# Patient Record
Sex: Male | Born: 1995 | Race: White | Hispanic: No | Marital: Single | Smoking: Former smoker
Health system: Southern US, Community
[De-identification: ages and names within clinical notes are randomized; demographics above are authoritative.]

## PROBLEM LIST (undated history)

## (undated) DIAGNOSIS — F329 Major depressive disorder, single episode, unspecified: Secondary | ICD-10-CM

## (undated) DIAGNOSIS — F32A Depression, unspecified: Secondary | ICD-10-CM

---
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
## 2016-12-30 ENCOUNTER — Emergency Department
Admission: EM | Admit: 2016-12-30 | Discharge: 2016-12-30 | Disposition: A | Discharge status: Home / Self Care | Payer: 59 | Attending: Emergency Medicine | Admitting: Emergency Medicine

## 2016-12-30 ENCOUNTER — Encounter: Payer: Self-pay | Admitting: Vascular Surgery

## 2016-12-30 ENCOUNTER — Emergency Department: Payer: 59

## 2016-12-30 DIAGNOSIS — R0600 Dyspnea, unspecified: Secondary | ICD-10-CM

## 2016-12-30 DIAGNOSIS — R0602 Shortness of breath: Secondary | ICD-10-CM | POA: Diagnosis not present

## 2016-12-30 DIAGNOSIS — F410 Panic disorder [episodic paroxysmal anxiety] without agoraphobia: Secondary | ICD-10-CM | POA: Insufficient documentation

## 2016-12-30 DIAGNOSIS — R1013 Epigastric pain: Secondary | ICD-10-CM | POA: Insufficient documentation

## 2016-12-30 DIAGNOSIS — Z791 Long term (current) use of non-steroidal anti-inflammatories (NSAID): Secondary | ICD-10-CM | POA: Diagnosis not present

## 2016-12-30 DIAGNOSIS — F17210 Nicotine dependence, cigarettes, uncomplicated: Secondary | ICD-10-CM | POA: Insufficient documentation

## 2016-12-30 DIAGNOSIS — R002 Palpitations: Secondary | ICD-10-CM

## 2016-12-30 HISTORY — DX: Depression, unspecified: F32.A

## 2016-12-30 HISTORY — DX: Major depressive disorder, single episode, unspecified: F32.9

## 2016-12-30 LAB — CBC WITH DIFFERENTIAL/PLATELET
BASOS ABS: 0.1 10*3/uL (ref 0–0.1)
Basophils Relative: 1 %
EOS PCT: 12 %
Eosinophils Absolute: 1.6 10*3/uL — ABNORMAL HIGH (ref 0–0.7)
HCT: 47 % (ref 40.0–52.0)
Hemoglobin: 16 g/dL (ref 13.0–18.0)
LYMPHS PCT: 29 %
Lymphs Abs: 3.7 10*3/uL — ABNORMAL HIGH (ref 1.0–3.6)
MCH: 30.1 pg (ref 26.0–34.0)
MCHC: 34.1 g/dL (ref 32.0–36.0)
MCV: 88.3 fL (ref 80.0–100.0)
MONO ABS: 1.1 10*3/uL — AB (ref 0.2–1.0)
Monocytes Relative: 9 %
Neutro Abs: 6.2 10*3/uL (ref 1.4–6.5)
Neutrophils Relative %: 49 %
PLATELETS: 268 10*3/uL (ref 150–440)
RBC: 5.32 MIL/uL (ref 4.40–5.90)
RDW: 12.9 % (ref 11.5–14.5)
WBC: 12.8 10*3/uL — ABNORMAL HIGH (ref 3.8–10.6)

## 2016-12-30 LAB — URINALYSIS, COMPLETE (UACMP) WITH MICROSCOPIC
Bacteria, UA: NONE SEEN
Bilirubin Urine: NEGATIVE
GLUCOSE, UA: NEGATIVE mg/dL
Ketones, ur: NEGATIVE mg/dL
Nitrite: NEGATIVE
PH: 7 (ref 5.0–8.0)
Protein, ur: NEGATIVE mg/dL
SPECIFIC GRAVITY, URINE: 1.004 — AB (ref 1.005–1.030)
SQUAMOUS EPITHELIAL / LPF: NONE SEEN

## 2016-12-30 LAB — COMPREHENSIVE METABOLIC PANEL
ALK PHOS: 69 U/L (ref 38–126)
ALT: 28 U/L (ref 17–63)
ANION GAP: 9 (ref 5–15)
AST: 36 U/L (ref 15–41)
Albumin: 4.7 g/dL (ref 3.5–5.0)
BILIRUBIN TOTAL: 0.7 mg/dL (ref 0.3–1.2)
BUN: 17 mg/dL (ref 6–20)
CALCIUM: 9.7 mg/dL (ref 8.9–10.3)
CO2: 26 mmol/L (ref 22–32)
Chloride: 102 mmol/L (ref 101–111)
Creatinine, Ser: 1.05 mg/dL (ref 0.61–1.24)
GFR calc non Af Amer: >60 mL/min (ref >60)
GLUCOSE: 102 mg/dL — AB (ref 65–99)
POTASSIUM: 3.5 mmol/L (ref 3.5–5.1)
SODIUM: 137 mmol/L (ref 135–145)
TOTAL PROTEIN: 7.7 g/dL (ref 6.5–8.1)

## 2016-12-30 LAB — LIPASE, BLOOD: Lipase: 25 U/L (ref 11–51)

## 2016-12-30 MED ORDER — GI COCKTAIL ~~LOC~~
ORAL | Status: AC
  Filled 2016-12-30: qty 30

## 2016-12-30 MED ORDER — IOPAMIDOL (ISOVUE-300) INJECTION 61%: 30.0000 mL | Freq: Once | INTRAVENOUS | Status: DC | PRN

## 2016-12-30 MED ORDER — GI COCKTAIL ~~LOC~~
30.0000 mL | Freq: Once | ORAL | Status: AC
  Administered 2016-12-30: 30 mL via ORAL

## 2016-12-30 MED ORDER — SODIUM CHLORIDE 0.9 % IV BOLUS (SEPSIS)
1000.0000 mL | Freq: Once | INTRAVENOUS | Status: AC
  Administered 2016-12-30: 1000 mL via INTRAVENOUS

## 2016-12-30 NOTE — Discharge Instructions (Signed)
You were evaluated for left-sided chest discomfort, palpitations, and shortness of breath, and as we discussed, although no certain cause was found, your exam and evaluation are reassuring in the emergency department today. As we discussed, I am most suspicious for panic attack as cause of the symptoms.  Return to emergency department immediately for any worsening symptoms, including pain with breathing, shortness of breath, heart racing, dizziness, passing out, weakness, numbness, or any other symptoms concerning to you.  I discussed, I would recommend following up with a psychologist, either through the [facility] clinic, or you may try through [facility], or by speaking with your insurance company for who may be covered.

## 2016-12-30 NOTE — ED Triage Notes (Signed)
Pt reports to the ED for eval of LUQ abd pain. Onset today. States he had a fatty meal last night (mayo and pesto). He states that around 8:00 am yesterday and he ignored it but became worse so he called 911. He denies any N/V/D, fevers, chills, or recent ETOH use. 12 lead obtain en route was unremarkable. Pt denies any hx of abd surgery.

## 2016-12-30 NOTE — ED Provider Notes (Signed)
[facility] Emergency Department Provider Note ____________________________________________   I have reviewed the triage vital signs and the triage nursing note.  HISTORY  Chief Complaint Abdominal Pain   Historian Patient  HPI Con Arganbright is a 21 y.o. male arrived by EMS with complaint of several hours of epigastric and left upper quadrant pain that started this morning. As a child he had some issues with chronic abdominal pain and indigestion although states it doesn't typically bother him.  No nausea or vomiting or diarrhea. No fever. No chest pain or coughing. Pain is moderate to severe in the midepigastrium and left upper quadrant. No lower abdominal pain.  No urinary symptoms.  Nothing seems to make it worse or better.   When I went back to talk to him again, he stated somewhat different description, which was left upper quadrant or lower chest pain associated with heart racing and shortness of breath. No pleuritic pain. He states he called because the heart was racing.    Past Medical History:  Diagnosis Date  . Depression   History of bipolar, no longer medication.  There are no active problems to display for this patient.   History reviewed. No pertinent surgical history.  Prior to Admission medications   Medication Sig Start Date End Date Taking? Authorizing Provider  diazepam (VALIUM) 5 MG tablet Take 1 tablet (5 mg total) by mouth every 8 (eight) hours as needed for muscle spasms. Patient not taking: Reported on 12/30/2016 11/10/15   Emily Filbert, MD  ibuprofen (ADVIL,MOTRIN) 800 MG tablet Take 1 tablet (800 mg total) by mouth every 8 (eight) hours as needed. 11/10/15   Emily Filbert, MD    Allergies  Allergen Reactions  . Other     Walnut   . Penicillins     Childhood allergy    No family history on file.  Social History Social History  Substance Use Topics  . Smoking status: Current Some Day Smoker    Types:  Cigarettes  . Smokeless tobacco: Never Used  . Alcohol use Yes     Comment: occasionally (weekly)    Review of Systems  Constitutional: Negative for fever. Eyes: Negative for visual changes. ENT: Negative for sore throat. Cardiovascular: Negative for Pleuritic chest pain. No ongoing chest pain.Marland Kitchen Respiratory: Positive for shortness of breath, earlier and currently gone. Gastrointestinal: Negative for vomiting and diarrhea. Genitourinary: Negative for dysuria. Musculoskeletal: Negative for back pain. Skin: Negative for rash. Neurological: Negative for headache. 10 point Review of Systems otherwise negative ____________________________________________   PHYSICAL EXAM:  VITAL SIGNS: ED Triage Vitals  Enc Vitals Group     BP 12/30/16 0900 137/80     Pulse Rate 12/30/16 0900 77     Resp 12/30/16 0903 16     Temp 12/30/16 0903 98.5 F (36.9 C)     Temp Source 12/30/16 0903 Oral     SpO2 12/30/16 0900 100 %     Weight --      Height --      Head Circumference --      Peak Flow --      Pain Score 12/30/16 0903 5     Pain Loc --      Pain Edu? --      Excl. in GC? --      Constitutional: Alert and oriented. Well appearing and in no distress. HEENT   Head: Normocephalic and atraumatic.      Eyes: Conjunctivae are normal. PERRL. Normal extraocular movements.  Ears:         Nose: No congestion/rhinnorhea.   Mouth/Throat: Mucous membranes are moist.   Neck: No stridor. Cardiovascular/Chest: Normal rate, regular rhythm.  No murmurs, rubs, or gallops. Respiratory: Normal respiratory effort without tachypnea nor retractions. Breath sounds are clear and equal bilaterally. No wheezes/rales/rhonchi. Gastrointestinal: Soft. No distention, no guarding, no rebound. Moderate tenderness to palpation in epigastrium and left upper quadrant. Mild tenderness over to the right upper quadrant. No lower abdominal tenderness to palpation.   Genitourinary/rectal:Deferred Musculoskeletal: Nontender with normal range of motion in all extremities. No joint effusions.  No lower extremity tenderness.  No edema. Neurologic:  Normal speech and language. No gross or focal neurologic deficits are appreciated. Skin:  Skin is warm, dry and intact. No rash noted. Psychiatric: Fairly anxious. Affect normal. Speech and behavior are normal. Patient exhibits appropriate insight and judgment.   ____________________________________________  LABS (pertinent positives/negatives)  Labs Reviewed  COMPREHENSIVE METABOLIC PANEL - Abnormal; Notable for the following:       Result Value   Glucose, Bld 102 (*)    All other components within normal limits  CBC WITH DIFFERENTIAL/PLATELET - Abnormal; Notable for the following:    WBC 12.8 (*)    Lymphs Abs 3.7 (*)    Monocytes Absolute 1.1 (*)    Eosinophils Absolute 1.6 (*)    All other components within normal limits  URINALYSIS, COMPLETE (UACMP) WITH MICROSCOPIC - Abnormal; Notable for the following:    Color, Urine STRAW (*)    APPearance CLEAR (*)    Specific Gravity, Urine 1.004 (*)    Hgb urine dipstick SMALL (*)    Leukocytes, UA TRACE (*)    All other components within normal limits  URINE CULTURE  LIPASE, BLOOD    ____________________________________________    EKG I, Governor Rooks, MD, the attending physician have personally viewed and interpreted all ECGs.  70 bpm. Normal sinus rhythm. Narrow QRS. Normal axis. Normal ST and T-wave ____________________________________________  RADIOLOGY All Xrays were viewed by me. Imaging interpreted by Radiologist.  Chest x-ray: Negative chest. __________________________________________  PROCEDURES  Procedure(s) performed: None  Critical Care performed: None  ____________________________________________   ED COURSE / ASSESSMENT AND PLAN  Pertinent labs & imaging results that were available during my care of the patient were  reviewed by me and considered in my medical decision making (see chart for details).   Initially EMS was reporting patient complaint of abdominal pain, and initially patient told me the middle and left upper quadrant or uncomfortable.  Laboratory studies were overall reassuring, but he did have a minimally elevated white blood cell count. No urinary symptoms, there was some white blood cells in the urine. On exam mild tenderness in the epigastrium and left upper quadrant. No lower abdominal tenderness. Unsure what to make of the urinalysis results. I will send a culture.  On reexamination after GI cocktail for what seemed to me most likely consistent with GERD or gastritis, patient stated that he had no more pain.  He was at that point able to give me more history and it sounds like maybe this was more of a panic attack. He states that he started with a left-sided lower chest discomfort and he started researching on the Internet and was up all night getting concerned about what might be wrong. At one point he started hyperventilating and having increased respiratory rate and heart racing. It was at this point that he called the ambulance.  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  He does have history of bipolar but has been off of medications for the a long time for that. He does not seem to be in acute depression or mania, and seems quite rational in discussing the role anxiety may be playing.  I doubt PE, ACS.  OK for discharge from ED today. Recommending follow-up with primary care doctor as well as a mental health provider.  I don't think he needs any emergency mental health treatment.    CONSULTATIONS:  None   Patient / Family / Caregiver informed of clinical course, medical decision-making process, and agree with plan.   I discussed return precautions, follow-up instructions, and discharge instructions with patient and/or family.  Discharge  instructions:  You were evaluated for left-sided chest discomfort, palpitations, and shortness of breath, and as we discussed, although no certain cause was found, your exam and evaluation are reassuring in the emergency department today. As we discussed, I am most suspicious for panic attack as cause of the symptoms.  Return to emergency department immediately for any worsening symptoms, including pain with breathing, shortness of breath, heart racing, dizziness, passing out, weakness, numbness, or any other symptoms concerning to you.  I discussed, I would recommend following up with a psychologist, either through the [facility] clinic, or you may try through [facility], or by speaking with your insurance company for who may be covered.   ___________________________________________   FINAL CLINICAL IMPRESSION(S) / ED DIAGNOSES   Final diagnoses:  Palpitations  Dyspnea, unspecified type  Panic attack              Note: This dictation was prepared with Dragon dictation. Any transcriptional errors that result from this process are unintentional    Governor Rooks, MD 12/30/16 1154

## 2016-12-31 LAB — URINE CULTURE: Culture: NO GROWTH

## 2017-05-31 ENCOUNTER — Emergency Department
Admission: EM | Admit: 2017-05-31 | Discharge: 2017-05-31 | Disposition: A | Discharge status: Home / Self Care | Payer: 59 | Attending: Emergency Medicine | Admitting: Emergency Medicine

## 2017-05-31 DIAGNOSIS — Z87891 Personal history of nicotine dependence: Secondary | ICD-10-CM | POA: Diagnosis not present

## 2017-05-31 DIAGNOSIS — F329 Major depressive disorder, single episode, unspecified: Secondary | ICD-10-CM | POA: Insufficient documentation

## 2017-05-31 DIAGNOSIS — T781XXA Other adverse food reactions, not elsewhere classified, initial encounter: Secondary | ICD-10-CM | POA: Insufficient documentation

## 2017-05-31 DIAGNOSIS — T7840XA Allergy, unspecified, initial encounter: Secondary | ICD-10-CM

## 2017-05-31 DIAGNOSIS — R1111 Vomiting without nausea: Secondary | ICD-10-CM | POA: Diagnosis present

## 2017-05-31 MED ORDER — PREDNISONE 20 MG PO TABS: 60.0000 mg | ORAL_TABLET | Freq: Every day | ORAL | 0 refills | Status: AC

## 2017-05-31 MED ORDER — FAMOTIDINE IN NACL 20-0.9 MG/50ML-% IV SOLN
20.0000 mg | Freq: Once | INTRAVENOUS | Status: AC
  Administered 2017-05-31: 20 mg via INTRAVENOUS
  Filled 2017-05-31: qty 50

## 2017-05-31 MED ORDER — DIPHENHYDRAMINE HCL 50 MG/ML IJ SOLN
25.0000 mg | Freq: Once | INTRAMUSCULAR | Status: AC
  Administered 2017-05-31: 25 mg via INTRAVENOUS
  Filled 2017-05-31: qty 1

## 2017-05-31 MED ORDER — METHYLPREDNISOLONE SODIUM SUCC 125 MG IJ SOLR
125.0000 mg | Freq: Once | INTRAMUSCULAR | Status: AC
  Administered 2017-05-31: 125 mg via INTRAVENOUS
  Filled 2017-05-31: qty 2

## 2017-05-31 MED ORDER — ONDANSETRON 4 MG PO TBDP: 4.0000 mg | ORAL_TABLET | Freq: Three times a day (TID) | ORAL | 0 refills | Status: AC | PRN

## 2017-05-31 MED ORDER — ONDANSETRON HCL 4 MG/2ML IJ SOLN
4.0000 mg | Freq: Once | INTRAMUSCULAR | Status: AC
  Administered 2017-05-31: 4 mg via INTRAVENOUS
  Filled 2017-05-31: qty 2

## 2017-05-31 MED ORDER — PROMETHAZINE HCL 25 MG/ML IJ SOLN
25.0000 mg | Freq: Once | INTRAMUSCULAR | Status: AC
Start: 2017-05-31 — End: 2017-05-31
  Administered 2017-05-31: 25 mg via INTRAVENOUS
  Filled 2017-05-31: qty 1

## 2017-05-31 MED ORDER — EPINEPHRINE 0.3 MG/0.3ML IJ SOAJ: 0.3000 mg | Freq: Once | INTRAMUSCULAR | 0 refills | Status: AC

## 2017-05-31 MED ORDER — DIPHENHYDRAMINE HCL 25 MG PO CAPS: 25.0000 mg | ORAL_CAPSULE | ORAL | 0 refills | Status: DC | PRN

## 2017-05-31 MED ORDER — SODIUM CHLORIDE 0.9 % IV BOLUS (SEPSIS)
1000.0000 mL | Freq: Once | INTRAVENOUS | Status: AC
  Administered 2017-05-31: 1000 mL via INTRAVENOUS

## 2017-05-31 MED ORDER — FAMOTIDINE 40 MG PO TABS: 40.0000 mg | ORAL_TABLET | Freq: Every evening | ORAL | 0 refills | Status: DC

## 2017-05-31 NOTE — ED Triage Notes (Signed)
Per EMS pt comes from [employer].  Pt tried a Kiwi for the first time and started to feel his throat close up and was itchy under his tongue.  EMS gave him a Benadryl and he began to vomit.  Pt is A&Ox4.  Pt is not vomiting upon arrival and states stomach is uncomfortable 5/10.

## 2017-05-31 NOTE — Discharge Instructions (Signed)
Please never eat Kiwi again, and make sure you always list it as an allergy.  Please read the instructions for the Epipen and carry it with you at all times.  Take the entire course of steroids (prednisone) and Pepcid, even if you are feeling better.  Take Benadryl as needed.  Return to the emergency department for rash, shortness of breath or difficulty swallowing, drooling, lightheadedness or fainting, or for any other symptoms concerning to you.

## 2017-05-31 NOTE — ED Provider Notes (Signed)
[facility] Emergency Department Provider Note  ____________________________________________  Time seen: Approximately 9:04 AM  I have reviewed the triage vital signs and the nursing notes.   HISTORY  Chief Complaint Allergic Reaction    HPI Jeremy Chapman is a 21 y.o. male with a remote history of allergy to penicillin and Augmentin, presenting with hives, nausea and vomiting after eating kiwi for the first time. At 7:50 AM, the patient tried kiwi for the first time in his life and developed immediatenausea and the sensation of throat closing. He was given an oral Benadryl tablet by EMS, but immediately vomited the entire pill. At this time, the patient is feeling significantly better, but continued to have an itchy throat.  He is hemodynamically stable on arrival to the ED.   Past Medical History:  Diagnosis Date  . Depression     There are no active problems to display for this patient.   History reviewed. No pertinent surgical history.  Current Outpatient Rx  . Order #: [id number] Class: Print  . Order #: [id number] Class: Print  . Order #: [id number] Class: Print  . Order #: [id number] Class: Print  . Order #: [id number] Class: Print  . Order #: [id number] Class: Print  . Order #: [id number] Class: Print    Allergies Kiwi extract; Other; and Penicillins  History reviewed. No pertinent family history.  Social History Social History  Substance Use Topics  . Smoking status: Former Smoker    Types: Cigarettes  . Smokeless tobacco: Never Used  . Alcohol use Yes     Comment: occasionally (weekly)    Review of Systems Constitutional: No fever/chills.no lightheadedness or syncope. Eyes: No visual changes. ENT: positive itchy throat and sensation of throat closing. No congestion or rhinorrhea. Cardiovascular: Denies chest pain. Denies palpitations. Respiratory: Denies shortness of breath.  No cough. Gastrointestinal: No abdominal pain.  positivenausea,  positive vomiting.  No diarrhea.  No constipation. Genitourinary: Negative for dysuria. Musculoskeletal: Negative for back pain. Skin: positive for facial hives. Neurological: Negative for headaches. No focal numbness, tingling or weakness.     ____________________________________________   PHYSICAL EXAM:  VITAL SIGNS: ED Triage Vitals  Enc Vitals Group     BP 05/31/17 0842 132/85     Pulse Rate 05/31/17 0842 81     Resp 05/31/17 0842 19     Temp 05/31/17 0842 98.3 F (36.8 C)     Temp Source 05/31/17 0842 Oral     SpO2 05/31/17 0842 97 %     Weight 05/31/17 0845 174 lb (78.9 kg)     Height 05/31/17 0845  (1.753 m)     Head Circumference --      Peak Flow --      Pain Score 05/31/17 0842 5     Pain Loc --      Pain Edu? --      Excl. in GC? --     Constitutional: Alert and oriented. Well appearing and in no acute distress. Answers questions appropriately. Eyes: Conjunctivae are normal.  EOMI. No scleral icterus. Head: Atraumatic. Nose: No congestion/rhinnorhea. Mouth/Throat: Mucous membranes are moist. No swelling of the lips, tongue. The posterior palate is symmetric without any swelling. The patient has a soft submandibular space. There is no stridor drooling, or difficulty maintaining secretions. Neck: No stridor.  Supple.  No meningismus. No submandibular lymphadenopathy. Cardiovascular: Normal rate, regular rhythm. No murmurs, rubs or gallops.  Respiratory: Normal respiratory effort.  No accessory muscle use or retractions. Lungs CTAB.  No wheezes, rales or  ronchi. Gastrointestinal: Soft, nontender and nondistended.  No guarding or rebound.  No peritoneal signs. Musculoskeletal: No LE edema. No ttp in the calves or palpable cords.  Negative Homan's sign. Neurologic:  A&Ox3.  Speech is clear.  Face and smile are symmetric.  EOMI.  Moves all extremities well. Skin:  Skin is warm, dry and intact. The patient has a diffusely erythematous face without any evidence of  rash on the chest, abdomen or back, or extremities. Psychiatric: Mood and affect are normal. Speech and behavior are normal.  Normal judgement.  ____________________________________________   LABS (all labs ordered are listed, but only abnormal results are displayed)  Labs Reviewed - No data to display ____________________________________________  EKG  ED ECG REPORT I, Rockne Menghini, the attending physician, personally viewed and interpreted this ECG.   Date: 05/31/2017  EKG Time: 927  Rate: 67  Rhythm: normal sinus rhythm  Axis: normal  Intervals:none  ST&T Change: No STEMI  ____________________________________________  RADIOLOGY  No results found.  ____________________________________________   PROCEDURES  Procedure(s) performed: None  Procedures  Critical Care performed: No ____________________________________________   INITIAL IMPRESSION / ASSESSMENT AND PLAN / ED COURSE  Pertinent labs & imaging results that were available during my care of the patient were reviewed by me and considered in my medical decision making (see chart for details).  21 y.o. Malewith a history of allergy to penicillin and Augmentin presenting with immediate vomiting, and sensation of throat closure after eating kiwi for the first time.Overall, the patient is hemodynamically stable and there is no evidence of hemodynamic collapse at this time. There is no evidence ofairway compromise. The patient does have some urticaria on the face, and throat itchy sensation, which we will treat with Solu-Medrol, Pepcid, and Benadryl. He will be given Zofran for his nausea as well as intravenous fluids. I have spoken with his mother, who is in [place], over the phone who agrees with the plan.Plan reevaluation for final disposition.  ----------------------------------------- 11:43 AM on 05/31/2017 -----------------------------------------  At this time, the patient continues to be  hemodynamically stable and is a symptomatic at this time. He is safe for discharge home. We discussed the medications he will be discharged with, and the importance of following up with a primary care physician. Return precautions were discussed. The patient will call his mother and I will be available for any questions as needed. Plan discharge at this time.  ____________________________________________  FINAL CLINICAL IMPRESSION(S) / ED DIAGNOSES  Final diagnoses:  Allergic reaction, initial encounter         NEW MEDICATIONS STARTED DURING THIS VISIT:  New Prescriptions   DIPHENHYDRAMINE (BENADRYL) 25 MG CAPSULE    Take 1 capsule (25 mg total) by mouth every 4 (four) hours as needed for itching or allergies.   EPINEPHRINE (EPIPEN 2-PAK) 0.3 MG/0.3 ML IJ SOAJ INJECTION    Inject 0.3 mLs (0.3 mg total) into the muscle once.   FAMOTIDINE (PEPCID) 40 MG TABLET    Take 1 tablet (40 mg total) by mouth every evening.   ONDANSETRON (ZOFRAN ODT) 4 MG DISINTEGRATING TABLET    Take 1 tablet (4 mg total) by mouth every 8 (eight) hours as needed for nausea or vomiting.   PREDNISONE (DELTASONE) 20 MG TABLET    Take 3 tablets (60 mg total) by mouth daily.      Rockne Menghini, MD 05/31/17 1148

## 2018-09-13 ENCOUNTER — Encounter: Payer: Self-pay | Admitting: Emergency Medicine

## 2018-09-13 ENCOUNTER — Emergency Department: Payer: 59

## 2018-09-13 ENCOUNTER — Emergency Department
Admission: EM | Admit: 2018-09-13 | Discharge: 2018-09-13 | Disposition: A | Discharge status: Home / Self Care | Payer: 59 | Attending: Emergency Medicine | Admitting: Emergency Medicine

## 2018-09-13 ENCOUNTER — Other Ambulatory Visit: Payer: Self-pay

## 2018-09-13 DIAGNOSIS — J189 Pneumonia, unspecified organism: Secondary | ICD-10-CM

## 2018-09-13 DIAGNOSIS — Z79899 Other long term (current) drug therapy: Secondary | ICD-10-CM | POA: Diagnosis not present

## 2018-09-13 DIAGNOSIS — Z87891 Personal history of nicotine dependence: Secondary | ICD-10-CM | POA: Insufficient documentation

## 2018-09-13 DIAGNOSIS — J181 Lobar pneumonia, unspecified organism: Secondary | ICD-10-CM

## 2018-09-13 DIAGNOSIS — R05 Cough: Secondary | ICD-10-CM | POA: Diagnosis present

## 2018-09-13 MED ORDER — DOXYCYCLINE HYCLATE 100 MG PO TABS: 100.0000 mg | ORAL_TABLET | Freq: Two times a day (BID) | ORAL | 0 refills | Status: DC

## 2018-09-13 MED ORDER — DOXYCYCLINE HYCLATE 100 MG PO TABS: 100.0000 mg | ORAL_TABLET | Freq: Two times a day (BID) | ORAL | 0 refills | Status: AC

## 2018-09-13 NOTE — ED Provider Notes (Signed)
[facility] Emergency Department Provider Note   ____________________________________________    I have reviewed the triage vital signs and the nursing notes.   HISTORY  Chief Complaint Cough     HPI Jeremy Chapman is a 23 y.o. male who presents with complaints of cough and mild shortness of breath and pleurisy.  Patient reports recently diagnosed with [facility], has been on Z-Pak, today is his third day.  Continues to feel fatigued, weak, mildly short of breath with exertion with a productive cough.  No recent travel.  No calf pain or swelling.  Does not smoke  Past Medical History:  Diagnosis Date  . Depression     There are no active problems to display for this patient.   History reviewed. No pertinent surgical history.  Prior to Admission medications   Medication Sig Start Date End Date Taking? Authorizing Provider  diazepam (VALIUM) 5 MG tablet Take 1 tablet (5 mg total) by mouth every 8 (eight) hours as needed for muscle spasms. Patient not taking: Reported on 12/30/2016 11/10/15   Emily Filbert, MD  diphenhydrAMINE (BENADRYL) 25 mg capsule Take 1 capsule (25 mg total) by mouth every 4 (four) hours as needed for itching or allergies. 05/31/17 05/31/18  Rockne Menghini, MD  doxycycline (VIBRA-TABS) 100 MG tablet Take 1 tablet (100 mg total) by mouth 2 (two) times daily. 09/13/18   Jene Every, MD  famotidine (PEPCID) 40 MG tablet Take 1 tablet (40 mg total) by mouth every evening. 05/31/17 05/31/18  Rockne Menghini, MD  ibuprofen (ADVIL,MOTRIN) 800 MG tablet Take 1 tablet (800 mg total) by mouth every 8 (eight) hours as needed. 11/10/15   Emily Filbert, MD  ondansetron (ZOFRAN ODT) 4 MG disintegrating tablet Take 1 tablet (4 mg total) by mouth every 8 (eight) hours as needed for nausea or vomiting. 05/31/17   Rockne Menghini, MD  predniSONE (DELTASONE) 20 MG tablet Take 3 tablets (60 mg total) by mouth  daily. 05/31/17   Rockne Menghini, MD     Allergies Kiwi extract; Other; and Penicillins  No family history on file.  Social History Social History   Tobacco Use  . Smoking status: Former Smoker    Types: Cigarettes  . Smokeless tobacco: Never Used  Substance Use Topics  . Alcohol use: Yes    Comment: occasionally (weekly)  . Drug use: No    Review of Systems  Constitutional: Positive fever  ENT: No sore throat. Cardiovascular: Pleurisy as above Respiratory: Above Gastrointestinal: No abdominal pain.    Musculoskeletal: Negative for calf pain or swelling  Neurological: Negative for headaches    ____________________________________________   PHYSICAL EXAM:  VITAL SIGNS: ED Triage Vitals  Enc Vitals Group     BP 09/13/18 1948 134/74     Pulse Rate 09/13/18 1948 91     Resp 09/13/18 1948 18     Temp 09/13/18 1948 98.6 F (37 C)     Temp Source 09/13/18 1948 Oral     SpO2 09/13/18 1948 100 %     Weight 09/13/18 1948 73.5 kg (162 lb)     Height 09/13/18 1948 1.753 m (5\' 9" )     Head Circumference --      Peak Flow --      Pain Score 09/13/18 1950 0     Pain Loc --      Pain Edu? --      Excl. in GC? --     Constitutional: Alert  and oriented. No acute distress. Pleasant and interactive Eyes: Conjunctivae are normal.   Nose: No congestion/rhinnorhea. Mouth/Throat: Mucous membranes are moist.    Cardiovascular: Normal rate, regular rhythm. Grossly normal heart sounds.  Good peripheral circulation. Respiratory: Normal respiratory effort.  No retractions.  Bibasilar Rales  Musculoskeletal: No lower extremity tenderness nor edema.  Warm and well perfused Neurologic:  Normal speech and language. No gross focal neurologic deficits are appreciated.  Skin:  Skin is warm, dry and intact. No rash noted. Psychiatric: Mood and affect are normal. Speech and behavior are normal.  ____________________________________________   LABS (all labs ordered are  listed, but only abnormal results are displayed)  Labs Reviewed - No data to display ____________________________________________  EKG  None ____________________________________________  RADIOLOGY  Chest x-ray demonstrates interstitial pattern left lung base consistent with pneumonia versus resolving pneumonia, discussed 9 mm nodule and the need for follow-up chest x-ray in 4 to 6 weeks ____________________________________________   PROCEDURES  Procedure(s) performed: No  Procedures   Critical Care performed: No ____________________________________________   INITIAL IMPRESSION / ASSESSMENT AND PLAN / ED COURSE  Pertinent labs & imaging results that were available during my care of the patient were reviewed by me and considered in my medical decision making (see chart for details).  Patient is only on day 3 of Z-Pak, he is allergic penicillin so we will not add high-dose amoxicillin.  I prescribed doxycycline for him to take counsel him to complete course of Z-Pak first to see how he feels.    ____________________________________________   FINAL CLINICAL IMPRESSION(S) / ED DIAGNOSES  Final diagnoses:  Community acquired pneumonia of left lower lobe of lung (HCC)        Note:  This document was prepared using Sales executive software and may include unintentional dictation errors.   Jene Every, MD 09/13/18 2045

## 2018-09-13 NOTE — ED Triage Notes (Signed)
Patient ambulatory to triage with steady gait, without difficulty or distress noted, mask place; pt reports dx with pneumonia last wk, rx zpak, benadryl and [facility]els[facility] ([facility]) but symptoms persist with prod cough green sputum

## 2018-09-13 NOTE — Discharge Instructions (Addendum)
As we discussed I have written an Rx for doxycycline. Please do not fill this until we see how you respond to the remainder of your Z-pak.   If you feel worsening sob you should return to the ED

## 2018-09-23 ENCOUNTER — Other Ambulatory Visit: Payer: Self-pay

## 2018-09-23 ENCOUNTER — Emergency Department
Admission: EM | Admit: 2018-09-23 | Discharge: 2018-09-23 | Disposition: A | Discharge status: Home / Self Care | Payer: 59 | Attending: Emergency Medicine | Admitting: Emergency Medicine

## 2018-09-23 ENCOUNTER — Emergency Department: Payer: 59

## 2018-09-23 DIAGNOSIS — G933 Postviral fatigue syndrome: Secondary | ICD-10-CM | POA: Insufficient documentation

## 2018-09-23 DIAGNOSIS — G9331 Postviral fatigue syndrome: Secondary | ICD-10-CM

## 2018-09-23 DIAGNOSIS — I308 Other forms of acute pericarditis: Secondary | ICD-10-CM | POA: Insufficient documentation

## 2018-09-23 DIAGNOSIS — I301 Infective pericarditis: Secondary | ICD-10-CM

## 2018-09-23 DIAGNOSIS — Z87891 Personal history of nicotine dependence: Secondary | ICD-10-CM | POA: Insufficient documentation

## 2018-09-23 DIAGNOSIS — R0602 Shortness of breath: Secondary | ICD-10-CM | POA: Diagnosis present

## 2018-09-23 LAB — CBC
HCT: 45.8 % (ref 39.0–52.0)
Hemoglobin: 15.6 g/dL (ref 13.0–17.0)
MCH: 30.4 pg (ref 26.0–34.0)
MCHC: 34.1 g/dL (ref 30.0–36.0)
MCV: 89.1 fL (ref 80.0–100.0)
Platelets: 334 10*3/uL (ref 150–400)
RBC: 5.14 MIL/uL (ref 4.22–5.81)
RDW: 12 % (ref 11.5–15.5)
WBC: 13.6 10*3/uL — ABNORMAL HIGH (ref 4.0–10.5)
nRBC: 0 % (ref 0.0–0.2)

## 2018-09-23 LAB — COMPREHENSIVE METABOLIC PANEL
ALT: 15 U/L (ref 0–44)
AST: 23 U/L (ref 15–41)
Albumin: 4.5 g/dL (ref 3.5–5.0)
Alkaline Phosphatase: 71 U/L (ref 38–126)
Anion gap: 8 (ref 5–15)
BUN: 15 mg/dL (ref 6–20)
CO2: 28 mmol/L (ref 22–32)
CREATININE: 1.12 mg/dL (ref 0.61–1.24)
Calcium: 9.5 mg/dL (ref 8.9–10.3)
Chloride: 104 mmol/L (ref 98–111)
GFR calc Af Amer: >60 mL/min (ref >60)
GFR calc non Af Amer: >60 mL/min (ref >60)
Glucose, Bld: 88 mg/dL (ref 70–99)
Potassium: 3.4 mmol/L — ABNORMAL LOW (ref 3.5–5.1)
Sodium: 140 mmol/L (ref 135–145)
Total Bilirubin: 0.8 mg/dL (ref 0.3–1.2)
Total Protein: 7.3 g/dL (ref 6.5–8.1)

## 2018-09-23 LAB — TROPONIN I: Troponin I: <0.03 ng/mL (ref <0.03)

## 2018-09-23 LAB — PROCALCITONIN: Procalcitonin: <0.1 ng/mL

## 2018-09-23 MED ORDER — SODIUM CHLORIDE 0.9% FLUSH: 3.0000 mL | Freq: Once | INTRAVENOUS | Status: DC

## 2018-09-23 MED ORDER — IBUPROFEN 600 MG PO TABS: 600.0000 mg | ORAL_TABLET | Freq: Three times a day (TID) | ORAL | 0 refills | Status: AC | PRN

## 2018-09-23 MED ORDER — BENZONATATE 100 MG PO CAPS
100.0000 mg | ORAL_CAPSULE | Freq: Once | ORAL | Status: AC
  Administered 2018-09-23: 100 mg via ORAL
  Filled 2018-09-23: qty 1

## 2018-09-23 MED ORDER — IPRATROPIUM-ALBUTEROL 0.5-2.5 (3) MG/3ML IN SOLN
3.0000 mL | Freq: Once | RESPIRATORY_TRACT | Status: AC
  Administered 2018-09-23: 3 mL via RESPIRATORY_TRACT
  Filled 2018-09-23: qty 3

## 2018-09-23 MED ORDER — OXYMETAZOLINE HCL 0.05 % NA SOLN
1.0000 | Freq: Once | NASAL | Status: AC
  Administered 2018-09-23: 1 via NASAL
  Filled 2018-09-23: qty 15

## 2018-09-23 MED ORDER — BENZONATATE 100 MG PO CAPS: 100.0000 mg | ORAL_CAPSULE | Freq: Four times a day (QID) | ORAL | 0 refills | Status: AC | PRN

## 2018-09-23 MED ORDER — KETOROLAC TROMETHAMINE 30 MG/ML IJ SOLN
15.0000 mg | Freq: Once | INTRAMUSCULAR | Status: AC
  Administered 2018-09-23: 15 mg via INTRAVENOUS
  Filled 2018-09-23: qty 1

## 2018-09-23 MED ORDER — LIDOCAINE HCL (PF) 1 % IJ SOLN
5.0000 mL | Freq: Once | INTRAMUSCULAR | Status: AC
  Administered 2018-09-23: 5 mL via INTRADERMAL
  Filled 2018-09-23: qty 5

## 2018-09-23 NOTE — ED Notes (Signed)
Patient transported to X-ray 

## 2018-09-23 NOTE — Discharge Instructions (Signed)
It was a pleasure to take care of you today, and thank you for coming to our emergency department.  If you have any questions or concerns before leaving please ask the nurse to grab me and I'm more than happy to go through your aftercare instructions again.  If you were prescribed any opioid pain medication today such as Norco, Vicodin, Percocet, morphine, hydrocodone, or oxycodone please make sure you do not drive when you are taking this medication as it can alter your ability to drive safely.  If you have any concerns once you are home that you are not improving or are in fact getting worse before you can make it to your follow-up appointment, please do not hesitate to call 911 and come back for further evaluation.  Merrily Brittle, MD  Results for orders placed or performed during the hospital encounter of 09/23/18  CBC  Result Value Ref Range   WBC 13.6 (H) 4.0 - 10.5 K/uL   RBC 5.14 4.22 - 5.81 MIL/uL   Hemoglobin 15.6 13.0 - 17.0 g/dL   HCT 16.1 09.6 - 04.5 %   MCV 89.1 80.0 - 100.0 fL   MCH 30.4 26.0 - 34.0 pg   MCHC 34.1 30.0 - 36.0 g/dL   RDW 40.9 81.1 - 91.4 %   Platelets 334 150 - 400 K/uL   nRBC 0.0 0.0 - 0.2 %  Troponin I - ONCE - STAT  Result Value Ref Range   Troponin I <0.03 <0.03 ng/mL  Comprehensive metabolic panel  Result Value Ref Range   Sodium 140 135 - 145 mmol/L   Potassium 3.4 (L) 3.5 - 5.1 mmol/L   Chloride 104 98 - 111 mmol/L   CO2 28 22 - 32 mmol/L   Glucose, Bld 88 70 - 99 mg/dL   BUN 15 6 - 20 mg/dL   Creatinine, Ser 7.82 0.61 - 1.24 mg/dL   Calcium 9.5 8.9 - 95.6 mg/dL   Total Protein 7.3 6.5 - 8.1 g/dL   Albumin 4.5 3.5 - 5.0 g/dL   AST 23 15 - 41 U/L   ALT 15 0 - 44 U/L   Alkaline Phosphatase 71 38 - 126 U/L   Total Bilirubin 0.8 0.3 - 1.2 mg/dL   GFR calc non Af Amer >60 >60 mL/min   GFR calc Af Amer >60 >60 mL/min   Anion gap 8 5 - 15  Procalcitonin  Result Value Ref Range   Procalcitonin <0.10 ng/mL   Dg Chest 2 View  Result Date:  09/23/2018 CLINICAL DATA:  23 year old male with shortness of breath. EXAM: CHEST - 2 VIEW COMPARISON:  Chest radiograph dated 09/13/2018 FINDINGS: The heart size and mediastinal contours are within normal limits. Both lungs are clear. The visualized skeletal structures are unremarkable. IMPRESSION: No active cardiopulmonary disease. Electronically Signed   By: Elgie Collard M.D.   On: 09/23/2018 01:56   Dg Chest 2 View  Result Date: 09/13/2018 CLINICAL DATA:  Diagnosed with pneumonia last week. Symptoms persist. Productive cough. EXAM: CHEST - 2 VIEW COMPARISON:  12/30/2016 FINDINGS: Heart size and pulmonary vascularity are normal. Mediastinal contours appear intact. Slight interstitial pattern in the left lung base. This could indicate a resolving area of infiltration. Nodular opacity over the left mid lung measuring 9 mm diameter. This was not present previously. Right lung is clear. No blunting of costophrenic angles. No pneumothorax. IMPRESSION: Slight interstitial pattern in the left lung base may represent resolving pneumonia. 9 mm nodular opacity over the left mid lung.  In a patient of this age, with probable pneumonia, follow-up chest x-ray in 4-6 weeks is recommended to exclude persistent nodularity. Electronically Signed   By: Burman NievesWilliam  Stevens M.D.   On: 09/13/2018 20:25

## 2018-09-23 NOTE — ED Triage Notes (Signed)
Patient c/o medial chest pain and SOB. Patient treated 2 weeks ago for pneumonia in this ED. Patient reports this feels the same. Patient reports his symptoms initially got better, but have been getting progressively worse since finishing antibiotics.

## 2018-09-23 NOTE — ED Provider Notes (Signed)
[facility] Emergency Department Provider Note  ____________________________________________   None    (approximate)  I have reviewed the triage vital signs and the nursing notes.   HISTORY  Chief Complaint Shortness of Breath   HPI Jeremy Chapman is a 23 y.o. male self presents to the emergency department with positional chest pain and shortness of breath that began earlier yesterday.  He is concerned because 2 weeks ago he was seen outpatient and diagnosed with pneumonia and subsequently came to our emergency department where he had a chest x-ray also read as pneumonia.  He took both of his antibiotics initially completely resolved however his symptoms have now worsened which prompted the visit today.   His symptoms are worse when he lies flat and almost completely resolved when he sits up.  His pain is sharp and aching.  Shortness of breath is mild.  He has no exertional shortness of breath.  He has a persistent dry cough and myalgias.  Symptoms have been gradual onset are now moderate severity.  Nonradiating.   Past Medical History:  Diagnosis Date  . Depression     There are no active problems to display for this patient.   History reviewed. No pertinent surgical history.  Prior to Admission medications   Medication Sig Start Date End Date Taking? Authorizing Provider  benzonatate (TESSALON PERLES) 100 MG capsule Take 1 capsule (100 mg total) by mouth every 6 (six) hours as needed for cough. 09/23/18 09/23/19  Merrily Brittleifenbark, Felina Tello, MD  diazepam (VALIUM) 5 MG tablet Take 1 tablet (5 mg total) by mouth every 8 (eight) hours as needed for muscle spasms. Patient not taking: Reported on 12/30/2016 11/10/15   Emily FilbertWilliams, Jonathan E, MD  doxycycline (VIBRA-TABS) 100 MG tablet Take 1 tablet (100 mg total) by mouth 2 (two) times daily. Patient not taking: Reported on 09/23/2018 09/13/18   Jene EveryKinner, Robert, MD  ibuprofen (ADVIL,MOTRIN) 600 MG tablet Take 1 tablet (600 mg  total) by mouth every 8 (eight) hours as needed. 09/23/18   Merrily Brittleifenbark, Ashmi Blas, MD  ondansetron (ZOFRAN ODT) 4 MG disintegrating tablet Take 1 tablet (4 mg total) by mouth every 8 (eight) hours as needed for nausea or vomiting. Patient not taking: Reported on 09/23/2018 05/31/17   Rockne MenghiniNorman, Anne-Caroline, MD  predniSONE (DELTASONE) 20 MG tablet Take 3 tablets (60 mg total) by mouth daily. Patient not taking: Reported on 09/23/2018 05/31/17   Rockne MenghiniNorman, Anne-Caroline, MD    Allergies Kiwi extract; Other; and Penicillins  No family history on file.  Social History Social History   Tobacco Use  . Smoking status: Former Smoker    Types: Cigarettes  . Smokeless tobacco: Never Used  Substance Use Topics  . Alcohol use: Yes    Comment: occasionally (weekly)  . Drug use: No    Review of Systems Constitutional: No fever/chills Eyes: No visual changes. ENT: No sore throat. Cardiovascular: Positive for chest pain. Respiratory: Positive for shortness of breath. Gastrointestinal: No abdominal pain.  No nausea, no vomiting.  No diarrhea.  No constipation. Genitourinary: Negative for dysuria. Musculoskeletal: Negative for back pain. Skin: Negative for rash. Neurological: Negative for headaches, focal weakness or numbness.   ____________________________________________   PHYSICAL EXAM:  VITAL SIGNS: ED Triage Vitals  Enc Vitals Group     BP 09/23/18 0131 133/81     Pulse Rate 09/23/18 0131 77     Resp 09/23/18 0131 17     Temp 09/23/18 0131 98.3 F (36.8 C)     Temp  src --      SpO2 09/23/18 0131 99 %     Weight 09/23/18 0129 163 lb 2.3 oz (74 kg)     Height 09/23/18 0129 5\' 9"  (1.753 m)     Head Circumference --      Peak Flow --      Pain Score 09/23/18 0131 5     Pain Loc --      Pain Edu? --      Excl. in GC? --     Constitutional: Alert and oriented x4 appears mildly uncomfortable although nontoxic no diaphoresis and speaks in full clear sentences Eyes: PERRL EOMI. Head:  Atraumatic. Nose: No congestion/rhinnorhea. Mouth/Throat: No trismus Neck: No stridor.   Cardiovascular: Normal rate, regular rhythm. Grossly normal heart sounds.  Good peripheral circulation. Respiratory: Normal respiratory effort.  No retractions. Lungs CTAB and moving good air Gastrointestinal: Soft nontender Musculoskeletal: No lower extremity edema   Neurologic:  Normal speech and language. No gross focal neurologic deficits are appreciated. Skin:  Skin is warm, dry and intact. No rash noted. Psychiatric: Mood and affect are normal. Speech and behavior are normal.    ____________________________________________   DIFFERENTIAL includes but not limited to  Post viral syndrome, pneumonia, pericarditis, viral syndrome ____________________________________________   LABS (all labs ordered are listed, but only abnormal results are displayed)  Labs Reviewed  CBC - Abnormal; Notable for the following components:      Result Value   WBC 13.6 (*)    All other components within normal limits  COMPREHENSIVE METABOLIC PANEL - Abnormal; Notable for the following components:   Potassium 3.4 (*)    All other components within normal limits  TROPONIN I  PROCALCITONIN    Lab work reviewed by me with slightly elevated white count which is nonspecific __________________________________________  EKG  ED ECG REPORT I, Merrily Brittle, the attending physician, personally viewed and interpreted this ECG.  Date: 09/23/2018 EKG Time:  Rate: 72 Rhythm: normal sinus rhythm QRS Axis: normal Intervals: normal ST/T Wave abnormalities: Diffuse mild concave up ST elevation although similar to previous EKG 2018 Narrative Interpretation: no evidence of acute ischemia  ____________________________________________  RADIOLOGY  Chest x-ray reviewed by me with no acute disease ____________________________________________   PROCEDURES  Procedure(s) performed: no  Procedures  Critical  Care performed: no  ____________________________________________   INITIAL IMPRESSION / ASSESSMENT AND PLAN / ED COURSE  Pertinent labs & imaging results that were available during my care of the patient were reviewed by me and considered in my medical decision making (see chart for details).   As part of my medical decision making, I reviewed the following data within the electronic MEDICAL RECORD NUMBER History obtained from family if available, nursing notes, old chart and ekg, as well as notes from prior ED visits.  Patient comes to the emergency department 2 weeks after pneumonia with positional chest pain.  His EKG does show mild diffuse concave up ST elevation although this is consistent with his old EKG.  His symptoms do sound quite a bit like pericarditis.  Labs obtained and troponin is negative going against myocarditis and chest x-ray is reassuring.  Do not suspect pericardial effusion.  His primary concern is the cough.  Nebulized lidocaine here along with oral Tessalon, Afrin, and a DuoNeb with lidocaine and it with near complete resolution of his symptoms.  Will be discharged home with primary care follow-up.      ____________________________________________   FINAL CLINICAL IMPRESSION(S) / ED DIAGNOSES  Final  diagnoses:  Acute viral pericarditis  Post viral syndrome      NEW MEDICATIONS STARTED DURING THIS VISIT:  Discharge Medication List as of 09/23/2018  3:30 AM    START taking these medications   Details  benzonatate (TESSALON PERLES) 100 MG capsule Take 1 capsule (100 mg total) by mouth every 6 (six) hours as needed for cough., Starting Fri 09/23/2018, Until Sat 09/23/2019, Print         Note:  This document was prepared using Dragon voice recognition software and may include unintentional dictation errors.    Merrily Brittle, MD 09/25/18 [phone]

## 2019-03-17 IMAGING — CR DG CHEST 2V
1 series · 2 of 2 positions shown · non-contrast
Comparison: Chest radiograph dated 09/13/2018

CLINICAL DATA: 23-year-old male with shortness of breath.

EXAM:
CHEST - 2 VIEW

[Series 1: dg chest 2 view · 0.14mm/px · 2 of 2 slices shown]
[im 1/2]
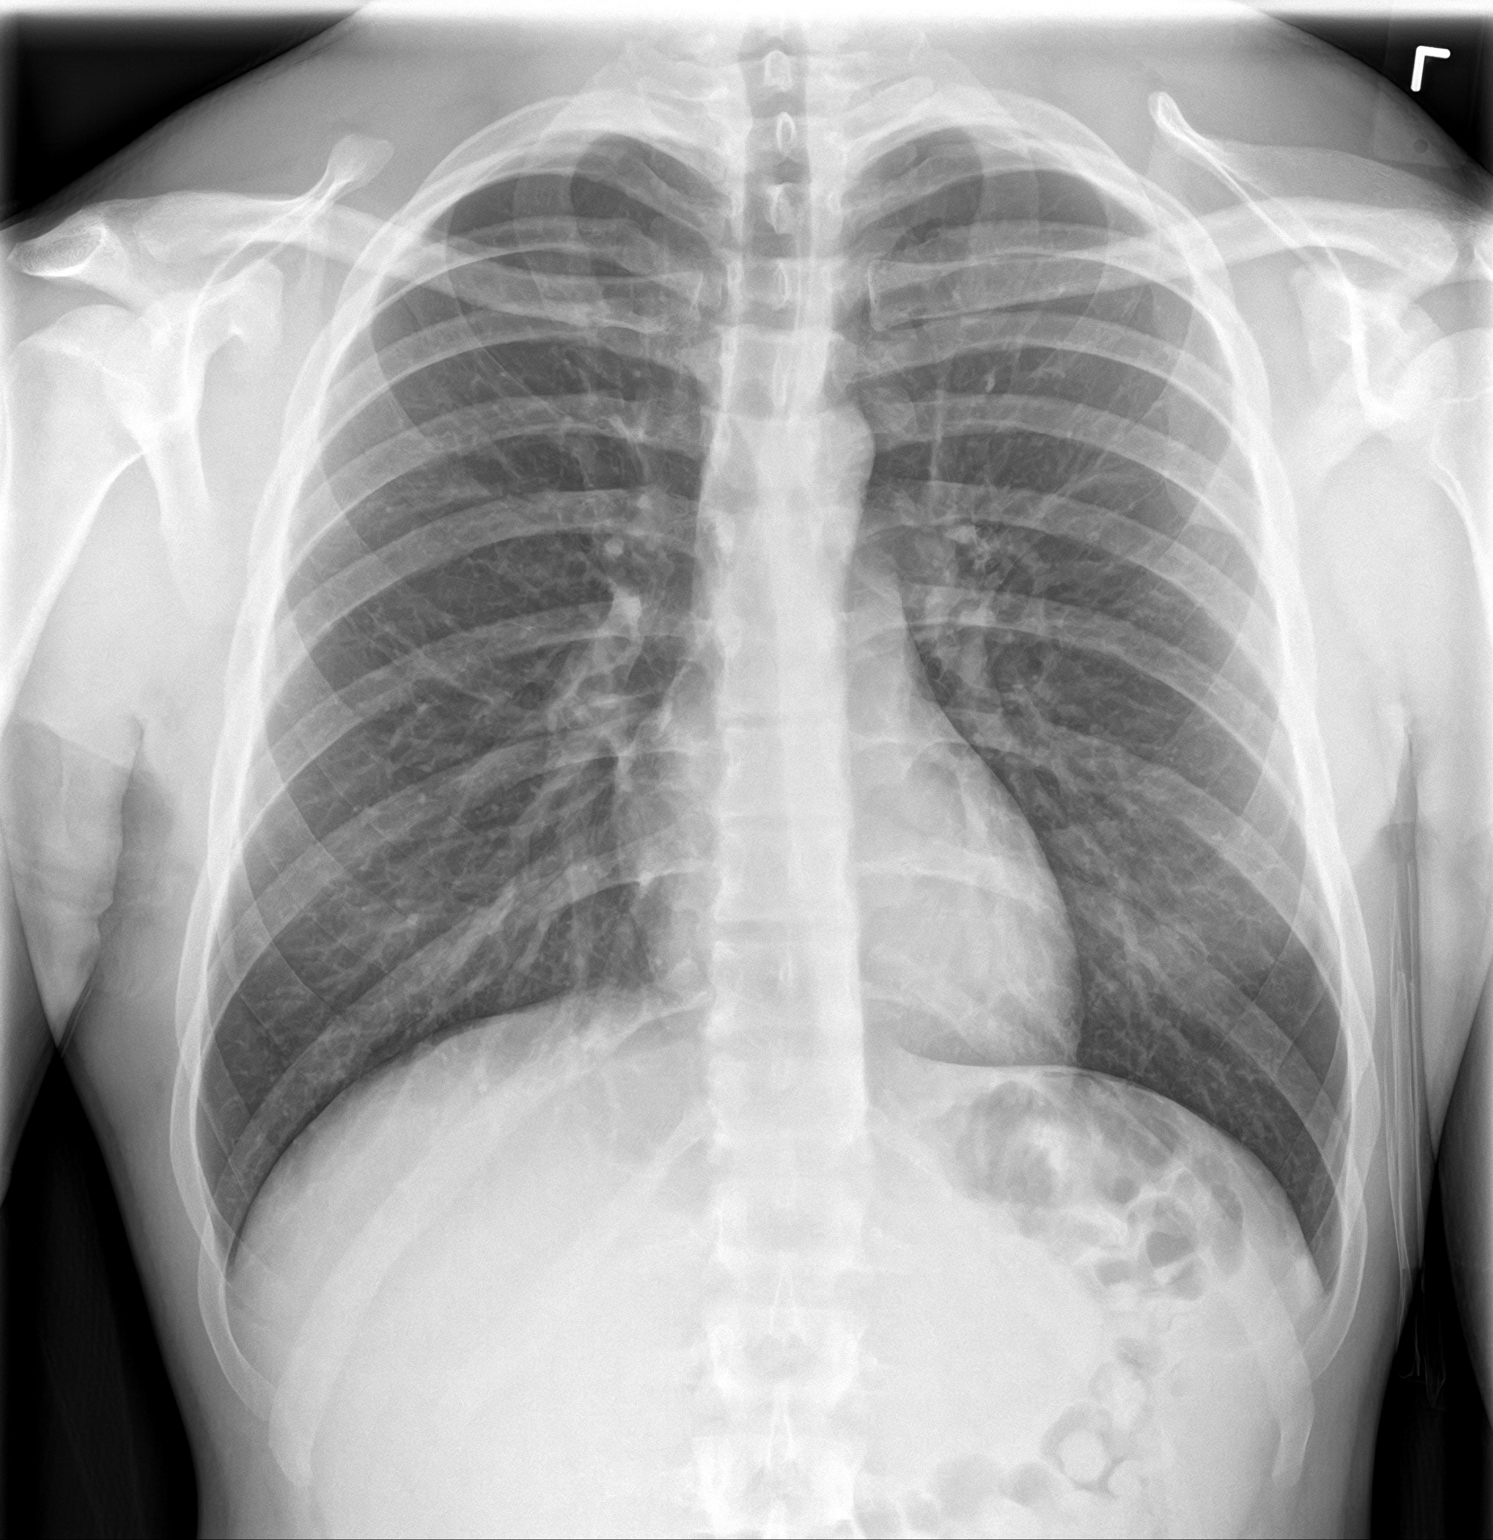
[im 2/2]
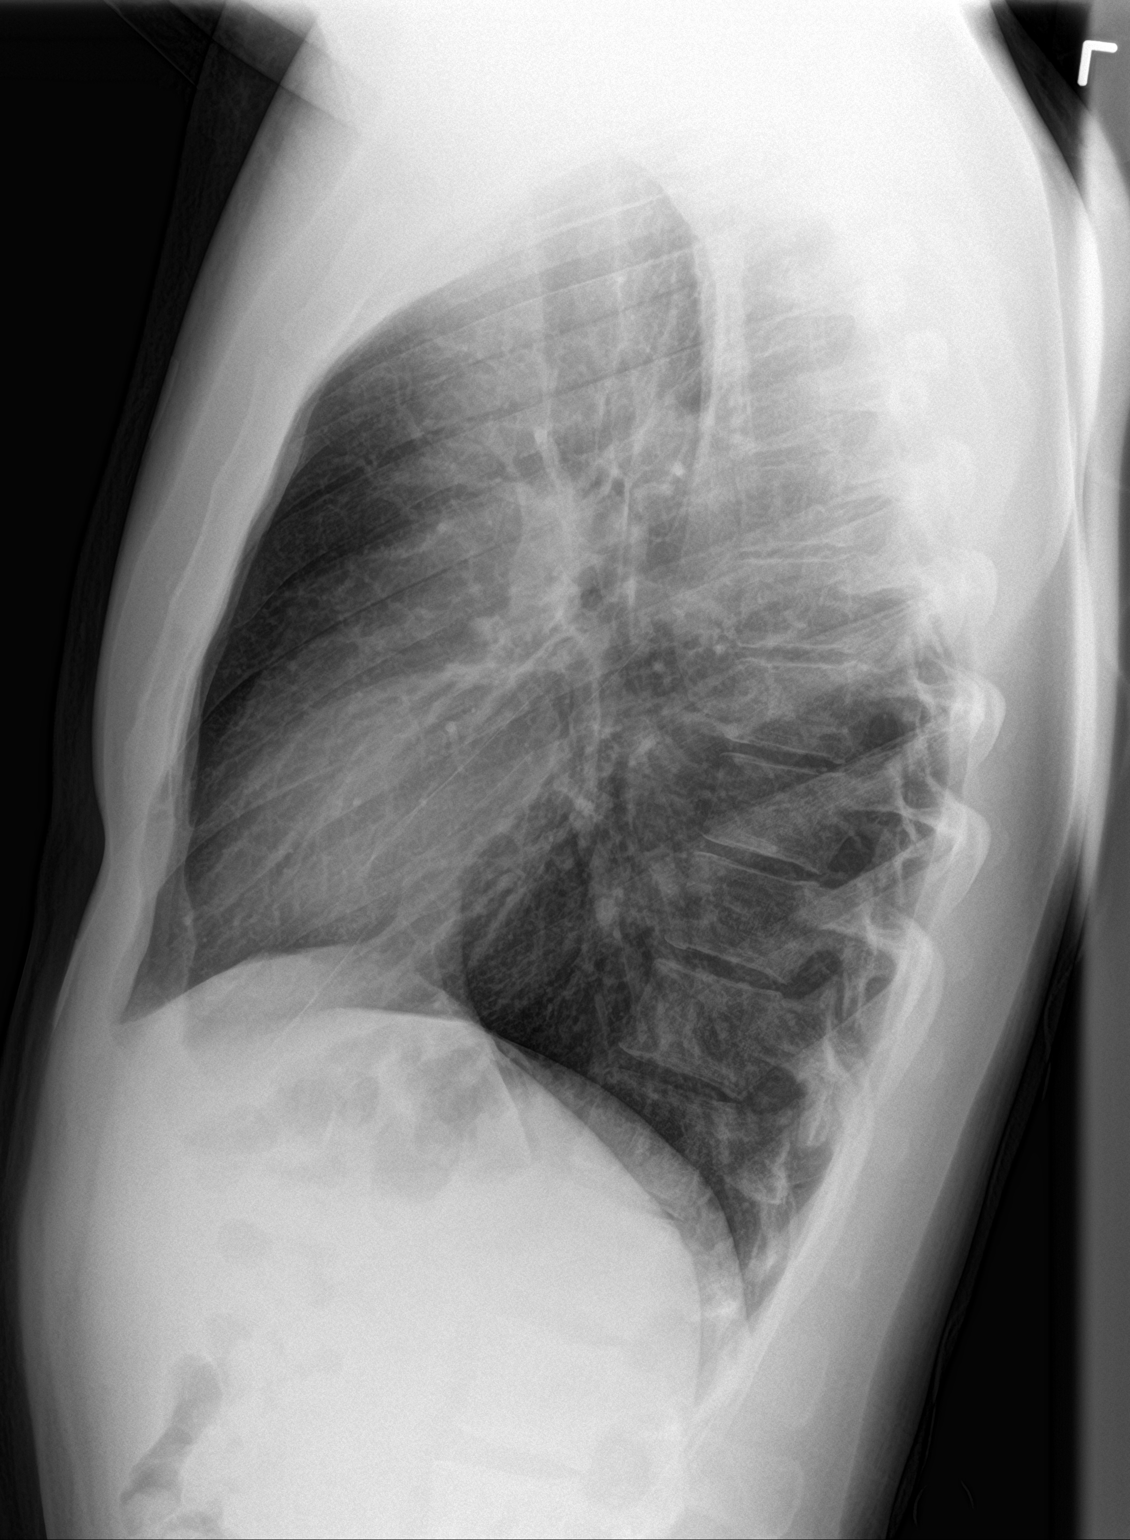

[2 of 2 positions shown; findings below may reference images not displayed]

FINDINGS: The heart size and mediastinal contours are within normal limits.
Both lungs are clear. The visualized skeletal structures are
unremarkable.
IMPRESSION: No active cardiopulmonary disease.
